# Patient Record
Sex: Male | Born: 1970 | Race: White | Hispanic: No | Marital: Married | Smoking: Former smoker
Health system: Southern US, Community
[De-identification: ages and names within clinical notes are randomized; demographics above are authoritative.]

## PROBLEM LIST (undated history)

## (undated) DIAGNOSIS — I10 Essential (primary) hypertension: Secondary | ICD-10-CM

## (undated) DIAGNOSIS — I219 Acute myocardial infarction, unspecified: Secondary | ICD-10-CM

## (undated) DIAGNOSIS — K5090 Crohn's disease, unspecified, without complications: Secondary | ICD-10-CM

## (undated) DIAGNOSIS — E119 Type 2 diabetes mellitus without complications: Secondary | ICD-10-CM

## (undated) DIAGNOSIS — G629 Polyneuropathy, unspecified: Secondary | ICD-10-CM

## (undated) DIAGNOSIS — I639 Cerebral infarction, unspecified: Secondary | ICD-10-CM

## (undated) HISTORY — PX: HERNIA REPAIR: SHX51

---

## 2013-05-27 ENCOUNTER — Emergency Department (HOSPITAL_COMMUNITY)
Admission: EM | Admit: 2013-05-27 | Discharge: 2013-05-28 | Disposition: A | Discharge status: Home / Self Care | Payer: Self-pay | Attending: Emergency Medicine | Admitting: Emergency Medicine

## 2013-05-27 ENCOUNTER — Encounter (HOSPITAL_COMMUNITY): Payer: Self-pay | Admitting: Emergency Medicine

## 2013-05-27 ENCOUNTER — Emergency Department (HOSPITAL_COMMUNITY): Payer: Self-pay

## 2013-05-27 DIAGNOSIS — Z8719 Personal history of other diseases of the digestive system: Secondary | ICD-10-CM | POA: Insufficient documentation

## 2013-05-27 DIAGNOSIS — R209 Unspecified disturbances of skin sensation: Secondary | ICD-10-CM | POA: Insufficient documentation

## 2013-05-27 DIAGNOSIS — Z8669 Personal history of other diseases of the nervous system and sense organs: Secondary | ICD-10-CM | POA: Insufficient documentation

## 2013-05-27 DIAGNOSIS — I10 Essential (primary) hypertension: Secondary | ICD-10-CM | POA: Insufficient documentation

## 2013-05-27 DIAGNOSIS — M79609 Pain in unspecified limb: Secondary | ICD-10-CM | POA: Insufficient documentation

## 2013-05-27 DIAGNOSIS — R6883 Chills (without fever): Secondary | ICD-10-CM | POA: Insufficient documentation

## 2013-05-27 DIAGNOSIS — E119 Type 2 diabetes mellitus without complications: Secondary | ICD-10-CM | POA: Insufficient documentation

## 2013-05-27 DIAGNOSIS — Z87891 Personal history of nicotine dependence: Secondary | ICD-10-CM | POA: Insufficient documentation

## 2013-05-27 DIAGNOSIS — I252 Old myocardial infarction: Secondary | ICD-10-CM | POA: Insufficient documentation

## 2013-05-27 DIAGNOSIS — L84 Corns and callosities: Secondary | ICD-10-CM | POA: Insufficient documentation

## 2013-05-27 DIAGNOSIS — M79671 Pain in right foot: Secondary | ICD-10-CM

## 2013-05-27 HISTORY — DX: Polyneuropathy, unspecified: G62.9

## 2013-05-27 HISTORY — DX: Type 2 diabetes mellitus without complications: E11.9

## 2013-05-27 HISTORY — DX: Acute myocardial infarction, unspecified: I21.9

## 2013-05-27 HISTORY — DX: Essential (primary) hypertension: I10

## 2013-05-27 HISTORY — DX: Crohn's disease, unspecified, without complications: K50.90

## 2013-05-27 HISTORY — DX: Cerebral infarction, unspecified: I63.9

## 2013-05-27 LAB — CBC WITH DIFFERENTIAL/PLATELET
BASOS ABS: 0 10*3/uL (ref 0.0–0.1)
BASOS PCT: 0 % (ref 0–1)
Eosinophils Absolute: 0.4 10*3/uL (ref 0.0–0.7)
Eosinophils Relative: 3 % (ref 0–5)
HEMATOCRIT: 39.1 % (ref 39.0–52.0)
HEMOGLOBIN: 14.4 g/dL (ref 13.0–17.0)
Lymphocytes Relative: 31 % (ref 12–46)
Lymphs Abs: 3.6 10*3/uL (ref 0.7–4.0)
MCH: 33.6 pg (ref 26.0–34.0)
MCHC: 36.8 g/dL — ABNORMAL HIGH (ref 30.0–36.0)
MCV: 91.4 fL (ref 78.0–100.0)
MONOS PCT: 7 % (ref 3–12)
Monocytes Absolute: 0.9 10*3/uL (ref 0.1–1.0)
NEUTROS ABS: 6.7 10*3/uL (ref 1.7–7.7)
NEUTROS PCT: 58 % (ref 43–77)
Platelets: 213 10*3/uL (ref 150–400)
RBC: 4.28 MIL/uL (ref 4.22–5.81)
RDW: 12.5 % (ref 11.5–15.5)
WBC: 11.5 10*3/uL — AB (ref 4.0–10.5)

## 2013-05-27 LAB — COMPREHENSIVE METABOLIC PANEL
ALT: 19 U/L (ref 0–53)
AST: 16 U/L (ref 0–37)
Albumin: 4.3 g/dL (ref 3.5–5.2)
Alkaline Phosphatase: 60 U/L (ref 39–117)
BUN: 17 mg/dL (ref 6–23)
CALCIUM: 9.3 mg/dL (ref 8.4–10.5)
CHLORIDE: 101 meq/L (ref 96–112)
CO2: 28 mEq/L (ref 19–32)
Creatinine, Ser: 1.04 mg/dL (ref 0.50–1.35)
GFR calc Af Amer: >90 mL/min (ref >90)
GFR calc non Af Amer: 88 mL/min — ABNORMAL LOW (ref >90)
GLUCOSE: 86 mg/dL (ref 70–99)
POTASSIUM: 4.3 meq/L (ref 3.7–5.3)
Sodium: 140 mEq/L (ref 137–147)
Total Bilirubin: 0.3 mg/dL (ref 0.3–1.2)
Total Protein: 7.9 g/dL (ref 6.0–8.3)

## 2013-05-27 MED ORDER — OXYCODONE-ACETAMINOPHEN 5-325 MG PO TABS: 1.0000 | ORAL_TABLET | Freq: Once | ORAL | Status: DC

## 2013-05-27 NOTE — ED Notes (Signed)
PT with callous to L foot x several months.  Several days ago pt noticed blood pooling under skin. Now entire bottom of foot tender on palpation and red.  Pt is diabetic.

## 2013-05-27 NOTE — ED Provider Notes (Signed)
CSN: [id number]     Arrival date & time 05/27/13  1847 History   First MD Initiated Contact with Patient 05/27/13 2227     Chief Complaint  Patient presents with  . Foot Pain   (Consider location/radiation/quality/duration/timing/severity/associated sxs/prior Treatment) Patient is a 43 y.o. male presenting with lower extremity pain.  Foot Pain Associated symptoms include chills. Pertinent negatives include no abdominal pain, chest pain, fever, nausea or vomiting.   43 yo male with PMH significant for Diabetes and peripheral neuropathy presents with 1-2 week hx of worsening right foot pain. Patient states he has had a callous on the bottom of the affected foot for about 4-5 months. Patient now reports constant throbbing pain in RIGHT foot when it is at rest and he rates it as 4/10. Pain is worse with weight bearing, which he described pain as sharp 10/10 with radiation up his lower leg. Patient denies any fever, CP, SOB, N/V/D. Denies any other symptoms. Patient states his blood glucose is well regulated and typically stays around 100-130s with diet only.   Past Medical History  Diagnosis Date  . Diabetes mellitus without complication   . Hypertension   . MI (myocardial infarction)   . Stroke   . Crohn's disease   . Neuropathy    Past Surgical History  Procedure Laterality Date  . Hernia repair     No family history on file. History  Substance Use Topics  . Smoking status: Former Smoker    Types: Cigarettes  . Smokeless tobacco: Not on file  . Alcohol Use: No    Review of Systems  Constitutional: Positive for chills. Negative for fever.  Respiratory: Negative for shortness of breath.   Cardiovascular: Negative for chest pain.  Gastrointestinal: Negative for nausea, vomiting, abdominal pain, diarrhea and constipation.  Skin: Positive for wound (RIGHT foot ).  Neurological:       Admits to parasthesias in first 3 toes of RIGHT foot.   All other systems reviewed and are  negative.    Allergies  Review of patient's allergies indicates no known allergies.  Home Medications   Current Outpatient Rx  Name  Route  Sig  Dispense  Refill  . acetaminophen (TYLENOL) 500 MG tablet   Oral   Take 1,000 mg by mouth every 6 (six) hours as needed for moderate pain.         . Skin Protectants, Misc. (EUCERIN) cream   Topical   Apply 1 application topically daily as needed for dry skin.         Marland Kitchen. oxyCODONE-acetaminophen (PERCOCET/ROXICET) 5-325 MG per tablet   Oral   Take 1-2 tablets by mouth every 6 (six) hours as needed for moderate pain or severe pain.   20 tablet   0    BP 127/72  Pulse 82  Temp(Src) 97.3 F (36.3 C) (Oral)  Resp 18  Ht 6\' 2"  (1.88 m)  Wt 224 lb (101.606 kg)  BMI 28.75 kg/m2  SpO2 99% Physical Exam  Nursing note and vitals reviewed. Constitutional: He is oriented to person, place, and time. He appears well-developed and well-nourished. No distress.  HENT:  Head: Normocephalic and atraumatic.  Eyes: Conjunctivae and EOM are normal.  Neck: Normal range of motion.  Cardiovascular: Normal rate, regular rhythm and normal heart sounds.   Pulmonary/Chest: Effort normal and breath sounds normal.  Musculoskeletal:       Feet:  No lower leg swelling or tenderness.   Neurological: He is alert and oriented to person,  place, and time.  Skin: Skin is warm and dry. No rash noted. He is not diaphoretic.  Psychiatric: He has a normal mood and affect. His behavior is normal.    ED Course  Procedures (including critical care time) Labs Review Labs Reviewed  CBC WITH DIFFERENTIAL - Abnormal; Notable for the following:    WBC 11.5 (*)    MCHC 36.8 (*)    All other components within normal limits  COMPREHENSIVE METABOLIC PANEL - Abnormal; Notable for the following:    GFR calc non Af Amer 88 (*)    All other components within normal limits   Imaging Review Dg Foot Complete Right  05/27/2013   CLINICAL DATA:  Ulceration on the  plantar surface of the foot for 1 week. Right foot pain.  EXAM: RIGHT FOOT COMPLETE - 3+ VIEW  COMPARISON:  Plain films right foot 05/07/2013.  FINDINGS: No evidence of osteomyelitis is identified. There is no fracture or dislocation. No soft tissue gas collection or radiopaque foreign body.  IMPRESSION: No acute finding.   Electronically Signed   By: Drusilla Kanner M.D.   On: 05/27/2013 20:58    EKG Interpretation   None       MDM   1. Right foot pain    Plain films of affected foot show no acute findings.  Mild Leukocytosis.  Patient afebrile with normal VS.   Advised follow up with podiatry for foot pain secondary to callous formation. Take medications as prescribed. Do not drive with pain medication. Return to ED should you develop Fever/chills, progressively worsening redness and swelling around the callous formation, or your affected foot loses color and becomes cool to touch.   Meds given in ED:  Medications - No data to display  Discharge Medication List as of 05/28/2013 12:18 AM    START taking these medications   Details  oxyCODONE-acetaminophen (PERCOCET/ROXICET) 5-325 MG per tablet Take 1-2 tablets by mouth every 6 (six) hours as needed for moderate pain or severe pain., Starting 05/28/2013, Until Discontinued, Print              Rudene Anda, PA-C 05/28/13 1236

## 2013-05-28 MED ORDER — OXYCODONE-ACETAMINOPHEN 5-325 MG PO TABS: 1.0000 | ORAL_TABLET | Freq: Four times a day (QID) | ORAL | Status: DC | PRN

## 2013-05-28 NOTE — Discharge Instructions (Signed)
Follow up with podiatry for foot pain secondary to callous formation. Take medications as prescribed. Do not drive with pain medication. Return to ED should you develop Fever/chills, progressively worsening redness and swelling around the callous formation,  or your affected foot loses color and becomes cool to touch.    Emergency Department Resource Guide 1) Find a Doctor and Pay Out of Pocket Although you won't have to find out who is covered by your insurance plan, it is a good idea to ask around and get recommendations. You will then need to call the office and see if the doctor you have chosen will accept you as a new patient and what types of options they offer for patients who are self-pay. Some doctors offer discounts or will set up payment plans for their patients who do not have insurance, but you will need to ask so you aren't surprised when you get to your appointment.  2) Contact Your Local Health Department Not all health departments have doctors that can see patients for sick visits, but many do, so it is worth a call to see if yours does. If you don't know where your local health department is, you can check in your phone book. The CDC also has a tool to help you locate your state's health department, and many state websites also have listings of all of their local health departments.  3) Find a Walk-in Clinic If your illness is not likely to be very severe or complicated, you may want to try a walk in clinic. These are popping up all over the country in pharmacies, drugstores, and shopping centers. They're usually staffed by nurse practitioners or physician assistants that have been trained to treat common illnesses and complaints. They're usually fairly quick and inexpensive. However, if you have serious medical issues or chronic medical problems, these are probably not your best option.  No Primary Care Doctor: - Call Health Connect at  [phone] - they can help you locate a primary  care doctor that  accepts your insurance, provides certain services, etc. - Physician Referral Service- [phone]  Chronic Pain Problems: Organization         Address  Phone   Notes  [facility]  [phone] Patients need to be referred by their primary care doctor.   Medication Assistance: Organization         Address  Phone   Notes  [facility]ication [facility] [address] [place], [place] [address] [phone] --Must be a resident of [facility] [place] -- Must have NO insurance coverage whatsoever (no Medicaid/ Medicare, etc.) -- The pt. MUST have a primary care doctor that directs their care regularly and follows them in the community   [facility]ist  [phone]   [employer]  [phone]) [phone]    Agencies that provide inexpensive medical care: Organization         Address  Phone   Notes  [facility]  [phone]) [phone]   [facility]    [phone]   [facility] [address] [place], [place] [address] [phone]   [facility] [place] [address], [place] [phone]   [facility]    [phone](336) [phone]   [facility]    [phone]   [facility]  [address], [place] Phone:  [phone](336) [phone], Fax:  [phone] Hours of Operation:  9  am - 6 pm, M-F.  Also accepts Medicaid/Medicare and self-pay.  [facility]  [address], [place] Phone: [phone], Fax: [phone]) [phone]. Hours of Operation:  8:30 am - 5:30 pm, M-F.  Also accepts Medicaid and self-pay.  [facility] [place] [address], [place] Phone: [phone]   [facility] [address], [place], [place] [phone], Ext. [phone] Mondays & Thursdays: 7-9 AM.  First 15 patients are seen on a first come, first serve basis.    Medicaid-accepting [facility]  Providers:  Organization         Address  Phone   Notes  [facility] [address], [place] [phone]) [phone] Also accepts self-pay patients.  [facility] [address] [place], [place]  [phone]   [facility] [address], [place] [phone](336) [phone]   [facility] [address] [place] [address], [place] [phone]) [phone]   Renaye RakersVeita Bland [address], [place]   [phone] Only accepts [place] Access [place] patients after they have their name applied to their card.   Self-Pay (no insurance) in [facility] [place]:  Organization         Address  Phone   Notes  Sickle Cell Patients, [facility] [address], [place] [phone]) [phone]   [facility] [address], [place] [phone]) [phone]   [facility] [place]  [address], [place] [phone]) [phone]   [facility]/Dr. Osei-Bonsu  [address] [place] [address], [place] or [phone], [place] [phone] Phone number for both [place] and [place] locations is the same.  [facility] [address], [place] [phone]   [facility] [place] [address] [place] [address], [place] or [address] [phone]) [phone]   [facility] [address], [place] [phone]) [phone], phone; [phone]) [phone], fax Sees patients 1st and 3rd Saturday of every month.  Must not qualify for public or private insurance (i.e. Medicaid, Medicare, [place] Health Choice, Veterans' Benefits)  Household income should be no more than 200% of the poverty level The clinic cannot treat you if you are pregnant or think you are pregnant  Sexually transmitted diseases are not treated at the clinic.    Dental Care: Organization         Address  Phone  Notes  [facility] [address], [place] [phone] Accepts children up to age 41 who are enrolled in [place] or [place] Health Choice; pregnant women with a Medicaid card; and children who have applied for Medicaid or [place] Health Choice, but were declined, whose parents can pay a reduced fee at time of service.  [facility] [place]  [address], [place] [phone] Accepts children up to age 41 who are enrolled in [place] or [place] Health Choice; pregnant women with a Medicaid card; and children who have applied for Medicaid or [place] Health Choice, but were declined, whose parents can pay a reduced fee at time of service.  [facility]  [address], [place] [phone]) [phone] Patients are seen by appointment only. Walk-ins are not accepted. [facility] will see patients 18 years of  age and older. °Monday - Tuesday (8am-5pm) °Most Wednesdays (8:30-5pm) °$30 per visit, cash only  °[facility] ° [address], [place] [phone] Patients are seen by appointment only. Walk-ins are not accepted. [facility] will see patients 18 years of age and older. °One Wednesday Evening (Monthly: Volunteer Based).  $30 per visit, cash only  °[facility]  [phone] for adults; Children under age 4, call Graduate Pediatric Dentistry at [phone]. Children aged 4-14, please call [phone] to request a pediatric application. ° Dental services are provided in all areas of dental care including fillings, crowns and bridges, complete and partial dentures, implants, gum treatment, root canals, and extractions. Preventive care is also provided. Treatment is provided to both adults and children. °Patients are selected via a lottery and there is often a waiting list. °  °[facility] [address], °[place] ° [phone] [email or web address] °  °[facility]  [address], [place], [place] [phone], Ext. [phone] Second and Fourth Thursday of each month, opens at 6:30 AM; Clinic ends at 9 AM.  Patients are seen on a first-come first-served basis, and a limited number are seen during each clinic.  ° °[facility] ° [address], [place], [place] [phone]   Eligibility Requirements °You must have lived in [place], [place], or [place] counties for at least the last three months. °  You cannot be eligible for state or federal sponsored healthcare insurance, including Veterans Administration, Medicaid, or Medicare. °  You generally cannot be eligible for healthcare insurance through your employer.  °  How to apply: °Eligibility screenings are held every Tuesday and Wednesday afternoon from 1:00 pm until 4:00 pm. You do not need an appointment for the interview!  °[facility] [address], [place], [place] [phone]   °[facility]  336-342-8273   °[facility]  336-703-3100   °[facility]  336-570-6415   ° °Behavioral Health Resources in the Community: °Intensive Outpatient Programs °Organization         Address  Phone  Notes  °[place] [facility] [address], [place], [place] [phone]   °[facility] [address], [place], [place] [phone]   °[facility] [address], [place], [place] ° 336-882-2125   °[facility] [address],  °[place], [place] 1-800-853-5163 or 336-641-4981   °Substance Abuse Resources °Organization         Address  Phone  Notes  °[facility]  336-882-2125   °[facility]  336-784-9470   °[facility]  336-285-9073   °[facility]  336-845-3988   °Residential & Outpatient Substance Abuse Program  1-800-659-3381   °Psychological Services °Organization         Address  Phone  Notes  °[facility]  336- 832-9600    °[facility]  336- 378-7881   °[facility] [address], [place] 1-800-853-5163 or 336-641-4981   ° °Mobile Crisis Teams °Organization         Address  Phone  Notes  °[facility], Mobile Crisis Care Unit  1-877-626-1772   °Assertive °Psychotherapeutic Services ° [address] [place], [place] 336-834-9664   °Sharon DeEsch [address] °[place] 336-554-5454   ° °Self-Help/Support Groups °Organization           Address  Phone             Notes  [place] of [place] - variety of support groups  [place] Call for more information  Narcotics Anonymous (NA), [facility] [address], [employer] [place]  2 meetings at this location   Special educational needs teacher         Address  Phone  Notes  [facility] [place],    [place]  1-3307155621   [facility]  [address], [place] [address]24, [place], [place]   [place], [place] [phone] Admissions: 8am-3pm M-F  Incentives Substance [place] [address],    [place], [place] [phone]   [facility] [address] [place], [place], [place]   [facility] [address],  [place], [place]   [facility] - Intensive Outpatient [address], [place], [place]   [facility] [address]  [place], [place] 1-814-487-9928 or [phone]   [facility] ([facility]) [address] [place], [place] Accepts Medicaid  [facility] [address]  [place] 1-(540) [phone] Substance Abuse/Addiction Treatment   [facility] Organization         Address  Phone  Notes  [facility]  [phone]   Domenic Schwab, PhD [address] [place], [place]   [address] or [phone]    [place], [place] [phone]   [facility] [address], [place], [place] [phone] Insurance/Medicaid/sponsorship through [facility] Families [address], [address]                                    [place], [place] [phone] [place] [address], [place] [phone]    Dr. Adele Schilder  [phone]   [facility]. 1) [address], [place] 2) [place] 3)  [place] [address], [place] [phone]  [phone]   [place] [phone] or [phone] (After Hours)

## 2013-05-28 NOTE — ED Provider Notes (Signed)
Medical screening examination/treatment/procedure(s) were conducted as a shared visit with non-physician practitioner(s) and myself.  I personally evaluated the patient during the encounter.  EKG Interpretation   None      Right foot callus with local ecchymosis, CR<2secs, baseline LT mild neuropathy  Doubt ischemia, infection, or significant trauma.  Hurman HornJohn M Lavante Toso, MD 05/30/13 [phone]

## 2013-05-30 ENCOUNTER — Encounter (HOSPITAL_COMMUNITY): Payer: Self-pay | Admitting: Emergency Medicine

## 2013-05-30 ENCOUNTER — Emergency Department (HOSPITAL_COMMUNITY)
Admission: EM | Admit: 2013-05-30 | Discharge: 2013-05-30 | Disposition: A | Discharge status: Home / Self Care | Payer: Self-pay | Attending: Emergency Medicine | Admitting: Emergency Medicine

## 2013-05-30 DIAGNOSIS — I252 Old myocardial infarction: Secondary | ICD-10-CM | POA: Insufficient documentation

## 2013-05-30 DIAGNOSIS — L84 Corns and callosities: Secondary | ICD-10-CM | POA: Insufficient documentation

## 2013-05-30 DIAGNOSIS — Z87891 Personal history of nicotine dependence: Secondary | ICD-10-CM | POA: Insufficient documentation

## 2013-05-30 DIAGNOSIS — Z760 Encounter for issue of repeat prescription: Secondary | ICD-10-CM | POA: Insufficient documentation

## 2013-05-30 DIAGNOSIS — E119 Type 2 diabetes mellitus without complications: Secondary | ICD-10-CM | POA: Insufficient documentation

## 2013-05-30 DIAGNOSIS — Z8673 Personal history of transient ischemic attack (TIA), and cerebral infarction without residual deficits: Secondary | ICD-10-CM | POA: Insufficient documentation

## 2013-05-30 DIAGNOSIS — I10 Essential (primary) hypertension: Secondary | ICD-10-CM | POA: Insufficient documentation

## 2013-05-30 DIAGNOSIS — L0390 Cellulitis, unspecified: Secondary | ICD-10-CM

## 2013-05-30 DIAGNOSIS — Z8719 Personal history of other diseases of the digestive system: Secondary | ICD-10-CM | POA: Insufficient documentation

## 2013-05-30 DIAGNOSIS — L03119 Cellulitis of unspecified part of limb: Secondary | ICD-10-CM

## 2013-05-30 DIAGNOSIS — L02619 Cutaneous abscess of unspecified foot: Secondary | ICD-10-CM | POA: Insufficient documentation

## 2013-05-30 MED ORDER — CEPHALEXIN 500 MG PO CAPS: 500.0000 mg | ORAL_CAPSULE | Freq: Four times a day (QID) | ORAL | Status: DC

## 2013-05-30 MED ORDER — SULFAMETHOXAZOLE-TRIMETHOPRIM 800-160 MG PO TABS: 1.0000 | ORAL_TABLET | Freq: Two times a day (BID) | ORAL | Status: AC

## 2013-05-30 NOTE — ED Notes (Signed)
Pt has large callous to bottom of right foot, was seen here on 1/19 for same due to having a blood blister on his foot, was dc home with percocet which he has not filled. Pt came back today due to the blister rupturing had having bleeding to bottom of foot. No redness or swelling noted, afebrile and ambulatory at triage.

## 2013-05-30 NOTE — ED Provider Notes (Signed)
CSN: [id number]     Arrival date & time 05/30/13  1739 History   First MD Initiated Contact with Patient 05/30/13 1838     Chief Complaint  Patient presents with  . Foot Pain   (Consider location/radiation/quality/duration/timing/severity/associated sxs/prior Treatment) HPI Comments: Pt is a 43 y/o male with a PMHx of diabetes, HTN, MI, and neuropathy who presents to the ED complaining of worsening callous to the bottom of his right foot since being seen in the ED on 1/19 for the same. At that time he was discharged home with Percocet which she has not yet filled, and has a scheduled for followup with the podiatrist next week. He returns today because he states the blister "ruptured" and was bleeding, states his pain has worsened. Pain worse with pressure. Denies fever or chills.  Patient is a 43 y.o. male presenting with lower extremity pain. The history is provided by the patient.  Foot Pain    Past Medical History  Diagnosis Date  . Diabetes mellitus without complication   . Hypertension   . MI (myocardial infarction)   . Stroke   . Crohn's disease   . Neuropathy    Past Surgical History  Procedure Laterality Date  . Hernia repair     History reviewed. No pertinent family history. History  Substance Use Topics  . Smoking status: Former Smoker    Types: Cigarettes  . Smokeless tobacco: Not on file  . Alcohol Use: No    Review of Systems  Skin: Positive for wound.  All other systems reviewed and are negative.    Allergies  Review of patient's allergies indicates no known allergies.  Home Medications   Current Outpatient Rx  Name  Route  Sig  Dispense  Refill  . acetaminophen (TYLENOL) 500 MG tablet   Oral   Take 1,000 mg by mouth every 6 (six) hours as needed for moderate pain.         . Skin Protectants, Misc. (EUCERIN) cream   Topical   Apply 1 application topically daily as needed for dry skin.         . cephALEXin (KEFLEX) 500 MG capsule   Oral  Take 1 capsule (500 mg total) by mouth 4 (four) times daily.   28 capsule   0   . oxyCODONE-acetaminophen (PERCOCET/ROXICET) 5-325 MG per tablet   Oral   Take 1-2 tablets by mouth every 6 (six) hours as needed for moderate pain or severe pain.   20 tablet   0   . sulfamethoxazole-trimethoprim (BACTRIM DS,SEPTRA DS) 800-160 MG per tablet   Oral   Take 1 tablet by mouth 2 (two) times daily.   14 tablet   0    BP 144/77  Pulse 77  Temp(Src) 97.9 F (36.6 C) (Oral)  Resp 18  SpO2 98% Physical Exam  Nursing note and vitals reviewed. Constitutional: He is oriented to person, place, and time. He appears well-developed and well-nourished. No distress.  HENT:  Head: Normocephalic and atraumatic.  Mouth/Throat: Oropharynx is clear and moist.  Eyes: Conjunctivae are normal.  Neck: Normal range of motion. Neck supple.  Cardiovascular: Normal rate, regular rhythm and normal heart sounds.   Pulmonary/Chest: Effort normal and breath sounds normal.  Abdominal: Soft.  Musculoskeletal: Normal range of motion. He exhibits no edema.  2 cm diameter callus formation under lateral aspect of the palm of right foot, tender, no active bleeding or open wounds. 1 cm surrounding cellulitis, tender and warm. Cellulitis does not cross  the joint line.  Neurological: He is alert and oriented to person, place, and time. No sensory deficit.  Skin: Skin is warm and dry. He is not diaphoretic.  Psychiatric: He has a normal mood and affect. His behavior is normal.    ED Course  Procedures (including critical care time) Labs Review Labs Reviewed - No data to display Imaging Review No results found.  EKG Interpretation   None       MDM   1. Callus of foot   2. Cellulitis    Patient with callous formation the bottom of foot with associated cellulitis. Well appearing and in NAD, normal VS. Cellulitis does not cross the joint line. Will empirically treat with antibiotics, he will fill his Percocet  prescription which he still has. Followup with the podiatrist next week as scheduled. Return precautions given. He should states understanding of plan and is agreeable.   Trevor Mace, PA-C 05/30/13 1935

## 2013-05-30 NOTE — ED Notes (Signed)
Pt has large callous on right footbed; no odor present; no drainage at this time. Reports it has been bleeding. Has foot MD appt. Monday.

## 2013-05-30 NOTE — Discharge Instructions (Signed)
Take antibiotic to completion. Followup with the foot doctor. Take the pain medication that was prescribed to you the other day as needed for severe pain.  Cellulitis Cellulitis is an infection of the skin and the tissue beneath it. The infected area is usually red and tender. Cellulitis occurs most often in the arms and lower legs.  CAUSES  Cellulitis is caused by bacteria that enter the skin through cracks or cuts in the skin. The most common types of bacteria that cause cellulitis are Staphylococcus and Streptococcus. SYMPTOMS   Redness and warmth.  Swelling.  Tenderness or pain.  Fever. DIAGNOSIS  Your caregiver can usually determine what is wrong based on a physical exam. Blood tests may also be done. TREATMENT  Treatment usually involves taking an antibiotic medicine. HOME CARE INSTRUCTIONS   Take your antibiotics as directed. Finish them even if you start to feel better.  Keep the infected arm or leg elevated to reduce swelling.  Apply a warm cloth to the affected area up to 4 times per day to relieve pain.  Only take over-the-counter or prescription medicines for pain, discomfort, or fever as directed by your caregiver.  Keep all follow-up appointments as directed by your caregiver. SEEK MEDICAL CARE IF:   You notice red streaks coming from the infected area.  Your red area gets larger or turns dark in color.  Your bone or joint underneath the infected area becomes painful after the skin has healed.  Your infection returns in the same area or another area.  You notice a swollen bump in the infected area.  You develop new symptoms. SEEK IMMEDIATE MEDICAL CARE IF:   You have a fever.  You feel very sleepy.  You develop vomiting or diarrhea.  You have a general ill feeling (malaise) with muscle aches and pains. MAKE SURE YOU:   Understand these instructions.  Will watch your condition.  Will get help right away if you are not doing well or get  worse. Document Released: 02/02/2005 Document Revised: 10/25/2011 Document Reviewed: 07/11/2011 [facility] Patient Information 2014 [place], [place].  Corns and Calluses Corns are small areas of thickened skin that usually occur on the top, sides, or tip of a toe. They contain a cone-shaped core with a point that can press on a nerve below. This causes pain. Calluses are areas of thickened skin that usually develop on hands, fingers, palms, soles of the feet, and heels. These are areas that experience frequent friction or pressure. CAUSES  Corns are usually the result of rubbing (friction) or pressure from shoes that are too tight or do not fit properly. Calluses are caused by repeated friction and pressure on the affected areas. SYMPTOMS  A hard growth on the skin.  Pain or tenderness under the skin.  Sometimes, redness and swelling.  Increased discomfort while wearing tight-fitting shoes. DIAGNOSIS  Your caregiver can usually tell what the problem is by doing a physical exam. TREATMENT  Removing the cause of the friction or pressure is usually the only treatment needed. However, sometimes medicines can be used to help soften the hardened, thickened areas. These medicines include salicylic acid plasters and 12% ammonium lactate lotion. These medicines should only be used under the direction of your caregiver. HOME CARE INSTRUCTIONS   Try to remove pressure from the affected area.  You may wear donut-shaped corn pads to protect your skin.  You may use a pumice stone or nonmetallic nail file to gently reduce the thickness of a corn.  Wear  properly fitted footwear.  If you have calluses on the hands, wear gloves during activities that cause friction.  If you have diabetes, you should regularly examine your feet. Tell your caregiver if you notice any problems with your feet. SEEK IMMEDIATE MEDICAL CARE IF:   You have increased pain, swelling, redness, or warmth in the affected  area.  Your corn or callus starts to drain fluid or bleeds.  You are not getting better, even with treatment. Document Released: 01/30/2004 Document Revised: 07/18/2011 Document Reviewed: 12/21/2010 [facility] Patient Information 2014 [place], [place].

## 2013-05-31 NOTE — ED Provider Notes (Signed)
Medical screening examination/treatment/procedure(s) were performed by non-physician practitioner and as supervising physician I was immediately available for consultation/collaboration.  EKG Interpretation   None         Pacen Watford W Fitz Matsuo, MD 05/31/13 1113 

## 2013-12-23 ENCOUNTER — Encounter (HOSPITAL_COMMUNITY): Payer: Self-pay | Admitting: Emergency Medicine

## 2013-12-23 ENCOUNTER — Emergency Department (HOSPITAL_COMMUNITY): Payer: Medicaid Other

## 2013-12-23 ENCOUNTER — Emergency Department (HOSPITAL_COMMUNITY)
Admission: EM | Admit: 2013-12-23 | Discharge: 2013-12-23 | Disposition: A | Discharge status: Home / Self Care | Payer: Medicaid Other | Attending: Emergency Medicine | Admitting: Emergency Medicine

## 2013-12-23 DIAGNOSIS — X500XXA Overexertion from strenuous movement or load, initial encounter: Secondary | ICD-10-CM | POA: Insufficient documentation

## 2013-12-23 DIAGNOSIS — Y9289 Other specified places as the place of occurrence of the external cause: Secondary | ICD-10-CM | POA: Diagnosis not present

## 2013-12-23 DIAGNOSIS — E119 Type 2 diabetes mellitus without complications: Secondary | ICD-10-CM | POA: Insufficient documentation

## 2013-12-23 DIAGNOSIS — S99929A Unspecified injury of unspecified foot, initial encounter: Secondary | ICD-10-CM

## 2013-12-23 DIAGNOSIS — Y9389 Activity, other specified: Secondary | ICD-10-CM | POA: Diagnosis not present

## 2013-12-23 DIAGNOSIS — Z8719 Personal history of other diseases of the digestive system: Secondary | ICD-10-CM | POA: Diagnosis not present

## 2013-12-23 DIAGNOSIS — S99919A Unspecified injury of unspecified ankle, initial encounter: Secondary | ICD-10-CM | POA: Diagnosis present

## 2013-12-23 DIAGNOSIS — I10 Essential (primary) hypertension: Secondary | ICD-10-CM | POA: Diagnosis not present

## 2013-12-23 DIAGNOSIS — I252 Old myocardial infarction: Secondary | ICD-10-CM | POA: Diagnosis not present

## 2013-12-23 DIAGNOSIS — S93401A Sprain of unspecified ligament of right ankle, initial encounter: Secondary | ICD-10-CM

## 2013-12-23 DIAGNOSIS — S93409A Sprain of unspecified ligament of unspecified ankle, initial encounter: Secondary | ICD-10-CM | POA: Insufficient documentation

## 2013-12-23 DIAGNOSIS — Z87891 Personal history of nicotine dependence: Secondary | ICD-10-CM | POA: Insufficient documentation

## 2013-12-23 DIAGNOSIS — S8990XA Unspecified injury of unspecified lower leg, initial encounter: Secondary | ICD-10-CM | POA: Insufficient documentation

## 2013-12-23 DIAGNOSIS — Z8669 Personal history of other diseases of the nervous system and sense organs: Secondary | ICD-10-CM | POA: Insufficient documentation

## 2013-12-23 DIAGNOSIS — Z8673 Personal history of transient ischemic attack (TIA), and cerebral infarction without residual deficits: Secondary | ICD-10-CM | POA: Insufficient documentation

## 2013-12-23 NOTE — Discharge Instructions (Signed)
Your x-rays did not show any broken bones or other concerning injury. Your provider(s) today suspect an ankle sprain. Followup with your primary care provider or an orthopedic specialist for continued evaluation and treatment. Use rest, ice, compression and elevation to reduce pain and swelling.    Ankle Sprain An ankle sprain is an injury to the strong, fibrous tissues (ligaments) that hold the bones of your ankle joint together.  CAUSES An ankle sprain is usually caused by a fall or by twisting your ankle. Ankle sprains most commonly occur when you step on the outer edge of your foot, and your ankle turns inward. People who participate in sports are more prone to these types of injuries.  SYMPTOMS   Pain in your ankle. The pain may be present at rest or only when you are trying to stand or walk.  Swelling.  Bruising. Bruising may develop immediately or within 1 to 2 days after your injury.  Difficulty standing or walking, particularly when turning corners or changing directions. DIAGNOSIS  Your caregiver will ask you details about your injury and perform a physical exam of your ankle to determine if you have an ankle sprain. During the physical exam, your caregiver will press on and apply pressure to specific areas of your foot and ankle. Your caregiver will try to move your ankle in certain ways. An X-ray exam may be done to be sure a bone was not broken or a ligament did not separate from one of the bones in your ankle (avulsion fracture).  TREATMENT  Certain types of braces can help stabilize your ankle. Your caregiver can make a recommendation for this. Your caregiver may recommend the use of medicine for pain. If your sprain is severe, your caregiver may refer you to a surgeon who helps to restore function to parts of your skeletal system (orthopedist) or a physical therapist. HOME CARE INSTRUCTIONS   Apply ice to your injury for 1-2 days or as directed by your caregiver. Applying ice  helps to reduce inflammation and pain.  Put ice in a plastic bag.  Place a towel between your skin and the bag.  Leave the ice on for 15-20 minutes at a time, every 2 hours while you are awake.  Only take over-the-counter or prescription medicines for pain, discomfort, or fever as directed by your caregiver.  Elevate your injured ankle above the level of your heart as much as possible for 2-3 days.  If your caregiver recommends crutches, use them as instructed. Gradually put weight on the affected ankle. Continue to use crutches or a cane until you can walk without feeling pain in your ankle.  If you have a plaster splint, wear the splint as directed by your caregiver. Do not rest it on anything harder than a pillow for the first 24 hours. Do not put weight on it. Do not get it wet. You may take it off to take a shower or bath.  You may have been given an elastic bandage to wear around your ankle to provide support. If the elastic bandage is too tight (you have numbness or tingling in your foot or your foot becomes cold and blue), adjust the bandage to make it comfortable.  If you have an air splint, you may blow more air into it or let air out to make it more comfortable. You may take your splint off at night and before taking a shower or bath. Wiggle your toes in the splint several times per day to  decrease swelling. SEEK MEDICAL CARE IF:   You have rapidly increasing bruising or swelling.  Your toes feel extremely cold or you lose feeling in your foot.  Your pain is not relieved with medicine. SEEK IMMEDIATE MEDICAL CARE IF:  Your toes are numb or blue.  You have severe pain that is increasing. MAKE SURE YOU:   Understand these instructions.  Will watch your condition.  Will get help right away if you are not doing well or get worse. Document Released: 04/25/2005 Document Revised: 01/18/2012 Document Reviewed: 05/07/2011 [facility] LPExitCare Patient Information 2015 [place]ExitCare, [place].  This information is not intended to replace advice given to you by your health care provider. Make sure you discuss any questions you have with your health care provider.

## 2013-12-23 NOTE — ED Provider Notes (Signed)
CSN: [id number]     Arrival date & time 12/23/13  0047 History   First MD Initiated Contact with Patient 12/23/13 0151     Chief Complaint  Patient presents with  . Foot Pain   HPI  History provided by the patient. Patient is a 43 year old male with history of hypertension, diabetes, Crohn's disease, CAD, CVA and peripheral neuropathy who presents with complaints of right ankle pain and swelling after injury. Patient states he was walking down the stairs when he twisted his right ankle causing acute pain and swelling. He has been able to place weight on the ankle but it causes worsened pain. Denies any weakness or numbness. No other injuries from the accident. He has been taking Tylenol during the day which has helped with pain. No other treatments used.    Past Medical History  Diagnosis Date  . Diabetes mellitus without complication   . Hypertension   . MI (myocardial infarction)   . Stroke   . Crohn's disease   . Neuropathy    Past Surgical History  Procedure Laterality Date  . Hernia repair     No family history on file. History  Substance Use Topics  . Smoking status: Former Smoker    Types: Cigarettes  . Smokeless tobacco: Not on file  . Alcohol Use: No    Review of Systems  Neurological: Negative for weakness and numbness.  All other systems reviewed and are negative.     Allergies  Review of patient's allergies indicates no known allergies.  Home Medications   Prior to Admission medications   Medication Sig Start Date End Date Taking? Authorizing Provider  acetaminophen (TYLENOL) 500 MG tablet Take 1,000 mg by mouth every 6 (six) hours as needed for moderate pain.   Yes Historical Provider, MD   BP 168/92  Pulse 80  Temp(Src) 99 F (37.2 C) (Oral)  Resp 16  Ht 6\' 2"  (1.88 m)  Wt 235 lb (106.595 kg)  BMI 30.16 kg/m2  SpO2 99% Physical Exam  Nursing note and vitals reviewed. Constitutional: He appears well-developed and well-nourished.  HENT:   Head: Normocephalic.  Cardiovascular: Normal rate and regular rhythm.   Pulmonary/Chest: Effort normal and breath sounds normal. No respiratory distress.  Musculoskeletal: He exhibits edema and tenderness.  There is mild to moderate swelling with tenderness over the anterior right ankle and slightly into the distal tibia area. No pain or deformity over the lateral or medial malleolus. No of pain over the proximal fifth metatarsal area. Normal dorsal pedal pulses. Sensations at baseline.  Neurological: He is alert.  Skin: Skin is warm.  Psychiatric: He has a normal mood and affect.    ED Course  Procedures   COORDINATION OF CARE:  Nursing notes reviewed. Vital signs reviewed. Initial pt interview and examination performed.   Filed Vitals:   12/23/13 0052  BP: 168/92  Pulse: 80  Temp: 99 F (37.2 C)  TempSrc: Oral  Resp: 16  Height: 6\' 2"  (1.88 m)  Weight: 235 lb (106.595 kg)  SpO2: 99%    2:25 AM-patient seen and evaluated. Resting in bed appears comfortable at this time no acute distress. X-rays reviewed without signs of fracture or dislocation. Suspect ankle sprain. Offered the patient pain medication in the emergency room and he is declined. He also reports having crutches at home that he can use for comfort. Will place in an ASO. Patient instructed to use rest, ice, compression elevation.   Treatment plan initiated:Medications - No data to  display   Imaging Review Dg Ankle Complete Right  12/23/2013   CLINICAL DATA:  Right ankle pain. Fell down stairs. Recent navicular fracture on CT scan of 12/06/2013.  EXAM: RIGHT ANKLE - COMPLETE 3+ VIEW  COMPARISON:  CT scan 12/06/2013.  FINDINGS: The ankle mortise is maintained. No acute fracture or osteochondral abnormality. No joint effusion.  IMPRESSION: No acute ankle fracture.   Electronically Signed   By: Loralie ChampagneMark  Gallerani M.D.   On: 12/23/2013 01:23   MDM   Final diagnoses:  Ankle sprain, right, initial encounter         Angus Sellereter S Jamille Yoshino, PA-C 12/23/13 0230

## 2013-12-23 NOTE — ED Notes (Signed)
Pt reports he rolled R ankle yesterday coming down stairs. Presents with swelling to ankle and pain to top aspect of foot.

## 2013-12-24 NOTE — ED Provider Notes (Signed)
Medical screening examination/treatment/procedure(s) were conducted as a shared visit with non-physician practitioner(s) and myself.  I personally evaluated the patient during the encounter.   EKG Interpretation None      Pt with ankle pain, he twisted his leg earlier. Neurovascularly intact. Will treat with RICE.  Derwood KaplanAnkit Stephon Weathers, MD 12/24/13 [phone]

## 2015-02-14 IMAGING — CR DG FOOT COMPLETE 3+V*R*
3 series · 3 of 3 positions shown · non-contrast
Comparison: Plain films right foot 05/07/2013.

CLINICAL DATA: Ulceration on the plantar surface of the foot for 1
week. Right foot pain.

EXAM:
RIGHT FOOT COMPLETE - 3+ VIEW

[x foot ap right]
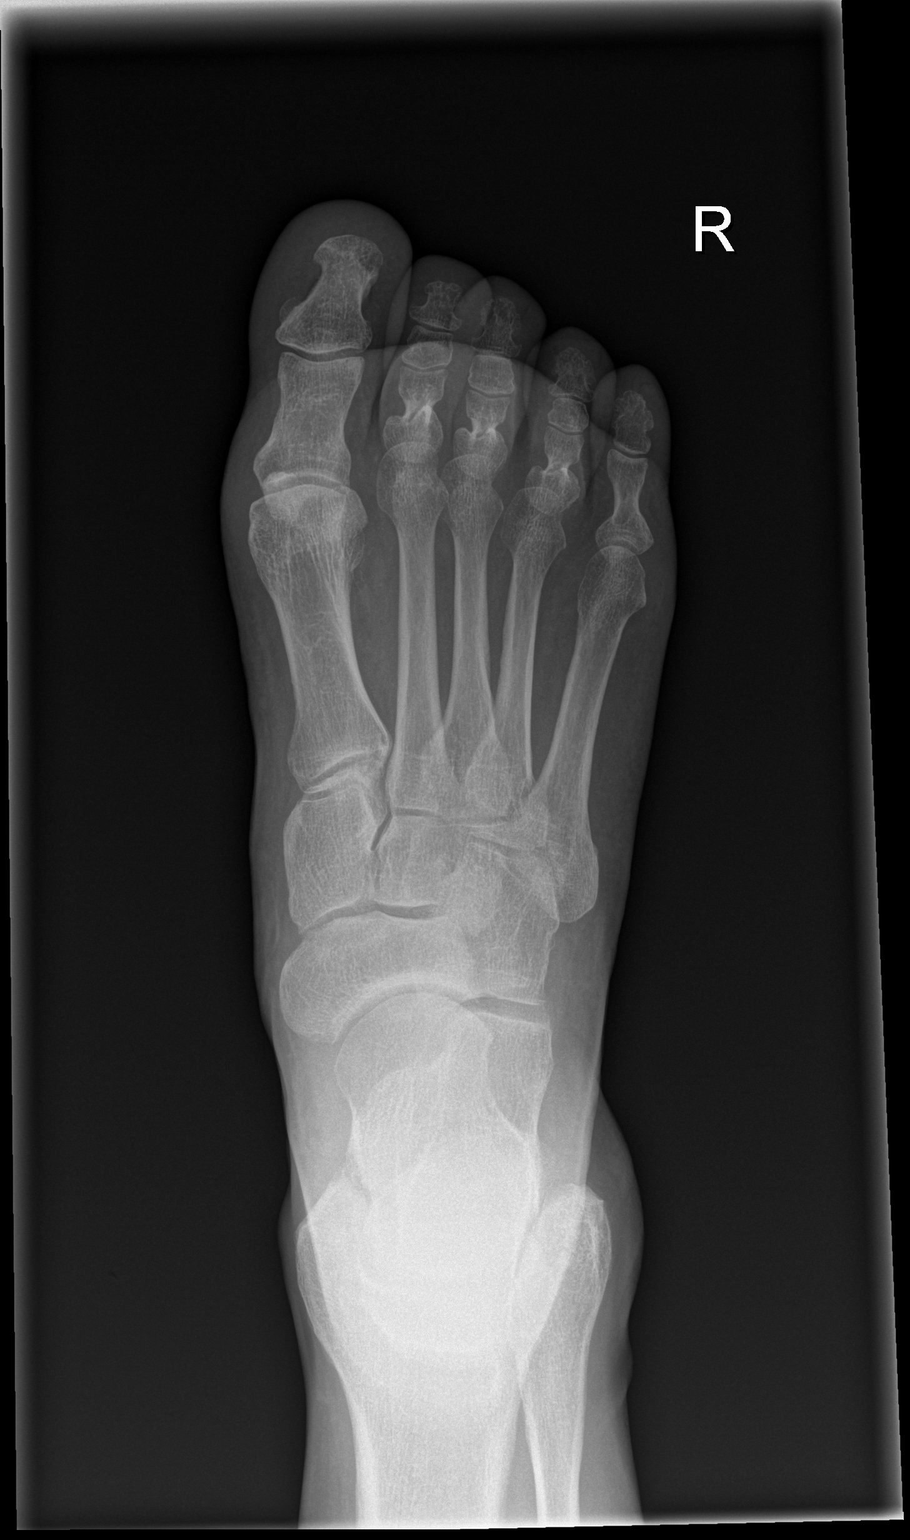

[x foot obl right]
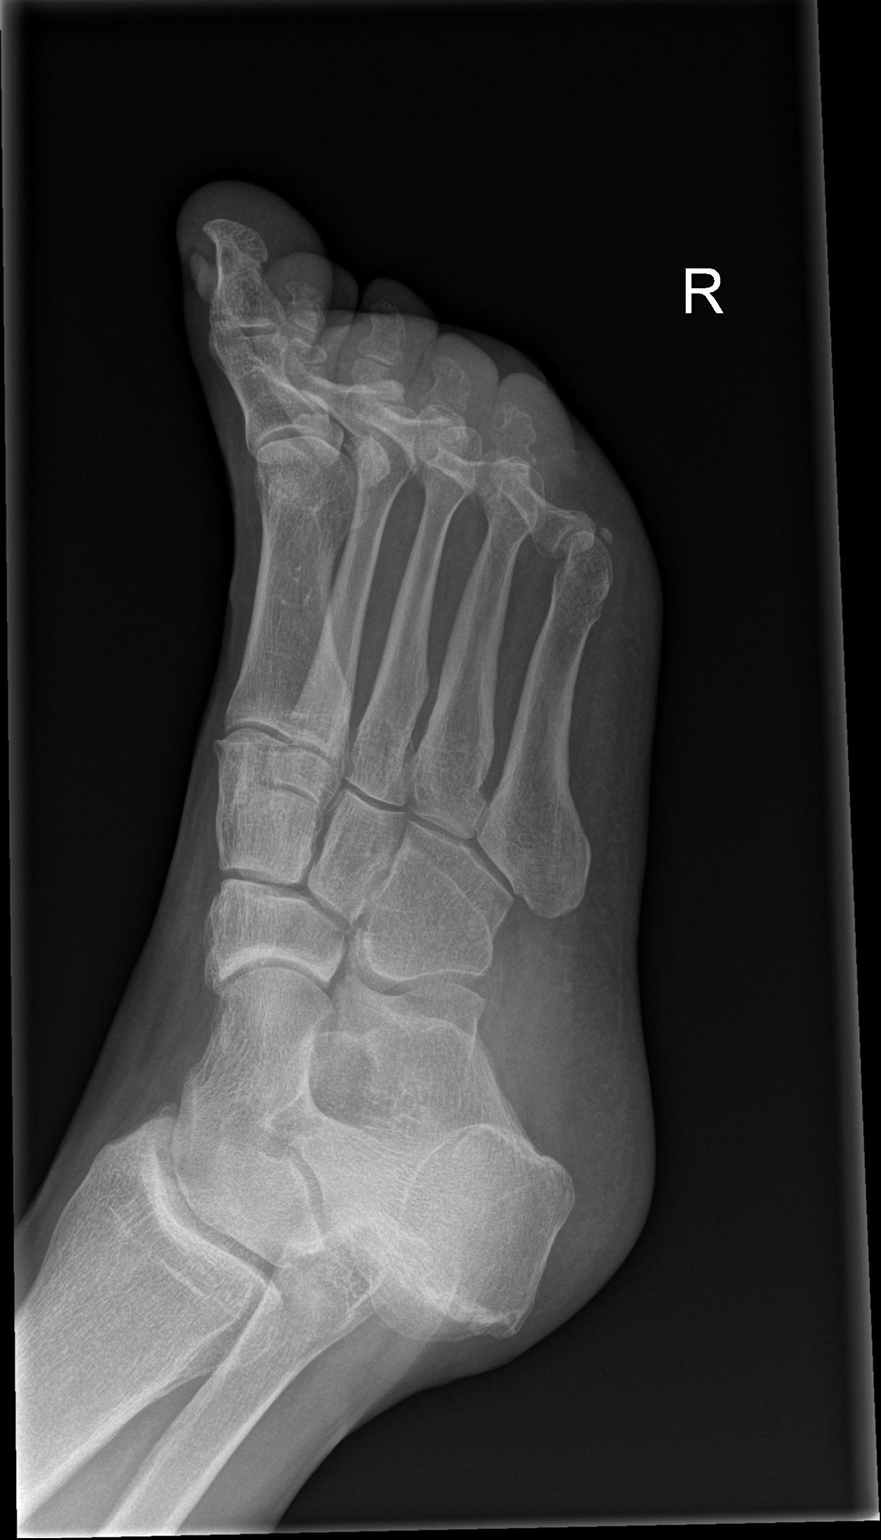

[x foot lat right]
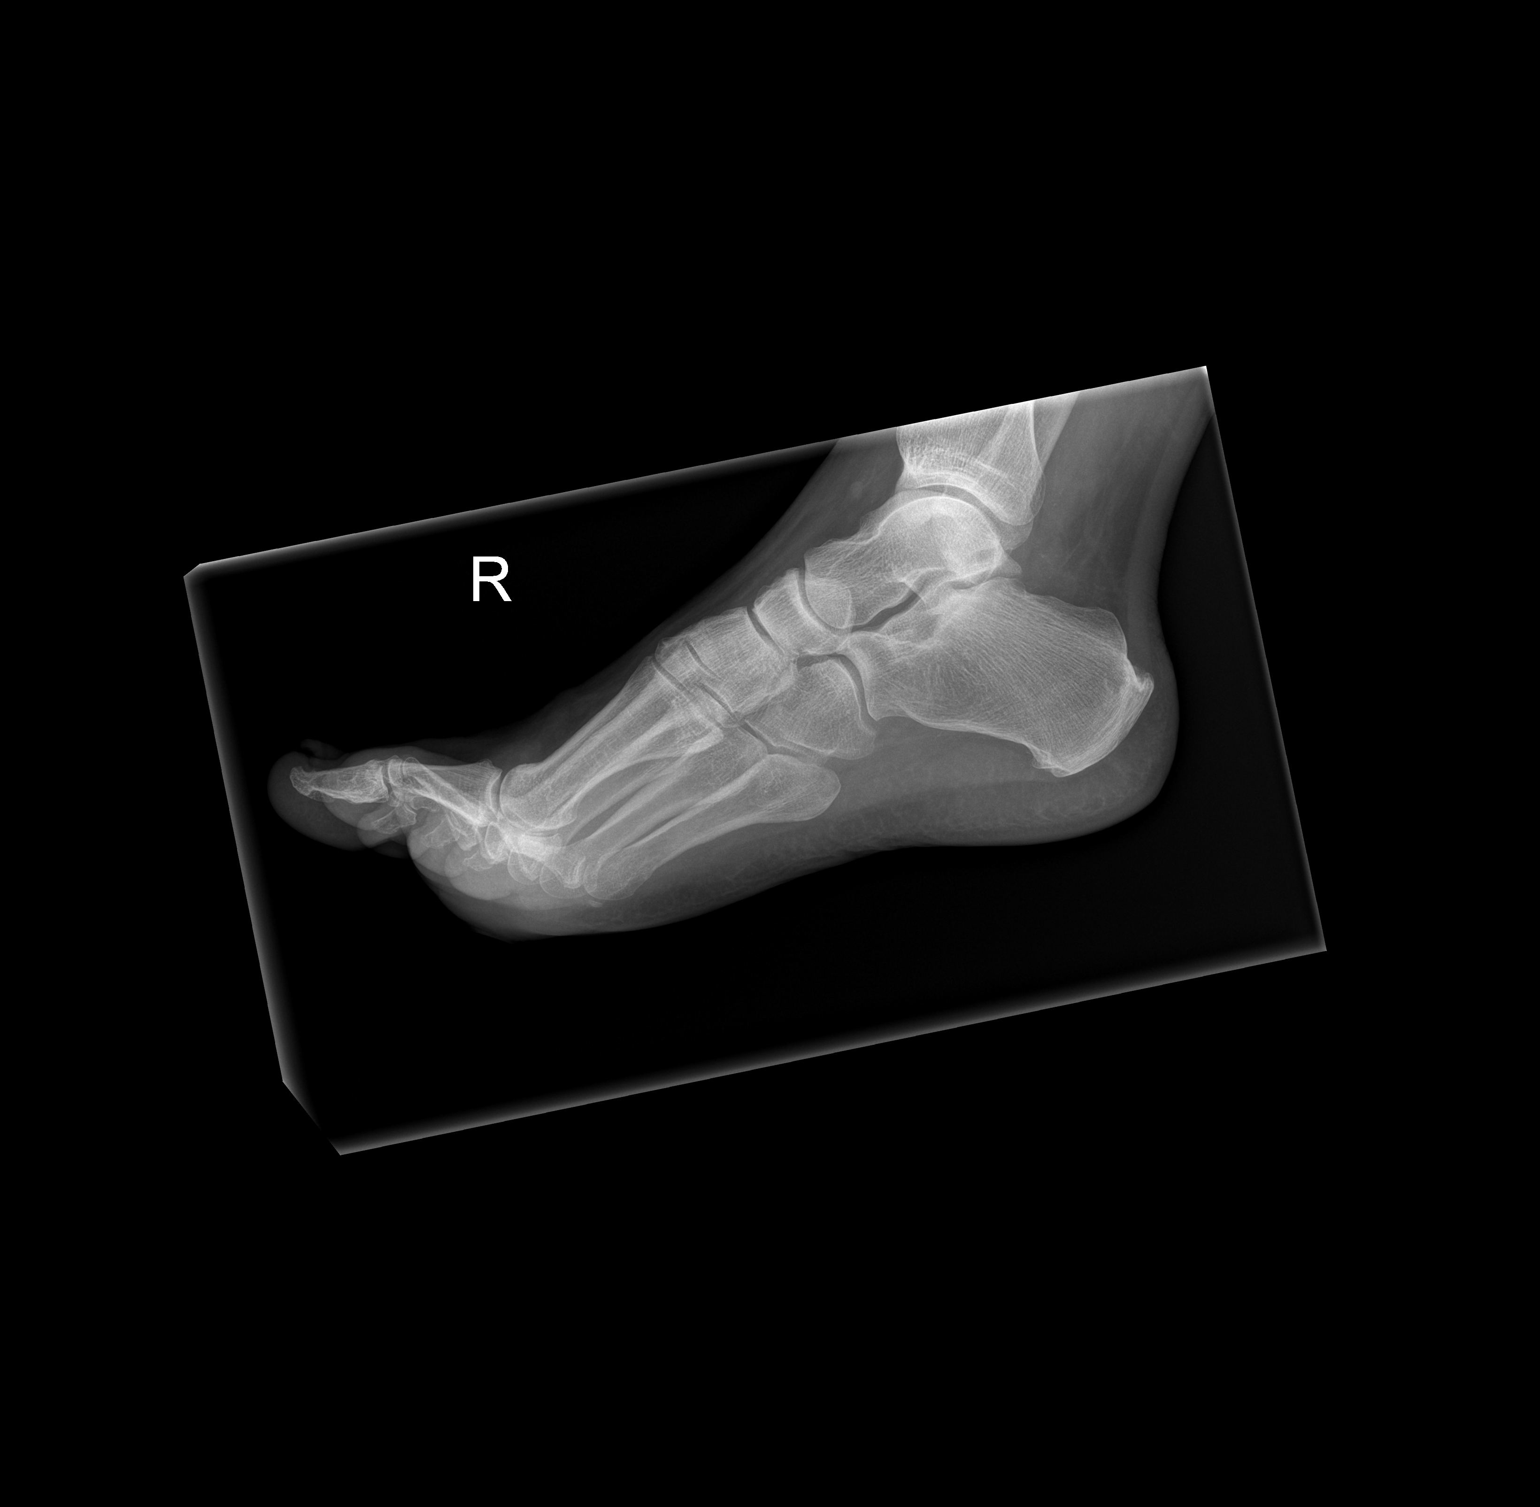

[3 of 3 positions shown; findings below may reference images not displayed]

FINDINGS: No evidence of osteomyelitis is identified. There is no fracture or
dislocation. No soft tissue gas collection or radiopaque foreign
body.
IMPRESSION: No acute finding.

## 2015-07-22 ENCOUNTER — Other Ambulatory Visit: Payer: Self-pay | Admitting: Legal Medicine

## 2015-07-22 DIAGNOSIS — S91331S Puncture wound without foreign body, right foot, sequela: Secondary | ICD-10-CM

## 2015-07-22 DIAGNOSIS — S81831S Puncture wound without foreign body, right lower leg, sequela: Secondary | ICD-10-CM

## 2015-07-23 ENCOUNTER — Ambulatory Visit
Admission: RE | Admit: 2015-07-23 | Discharge: 2015-07-23 | Disposition: A | Discharge status: Home / Self Care | Payer: Worker's Compensation | Source: Ambulatory Visit | Attending: Legal Medicine | Admitting: Legal Medicine

## 2015-07-23 DIAGNOSIS — S91331S Puncture wound without foreign body, right foot, sequela: Secondary | ICD-10-CM

## 2015-07-23 MED ORDER — GADOBENATE DIMEGLUMINE 529 MG/ML IV SOLN
20.0000 mL | Freq: Once | INTRAVENOUS | Status: AC | PRN
  Administered 2015-07-23: 20 mL via INTRAVENOUS

## 2017-04-11 IMAGING — MR MR FOOT*R* WO/W CM
4 of 9 series · 15 of 40 positions shown · IV contrast (multihance)
Comparison: None.

CLINICAL DATA: Stepped on a E wire that punctured the bottom of the
foot. Swelling.

EXAM:
MRI OF THE RIGHT FOREFOOT WITHOUT AND WITH CONTRAST
TECHNIQUE: Multiplanar, multisequence MR imaging was performed both before and
after administration of intravenous contrast.
CONTRAST:  20mL MULTIHANCE GADOBENATE DIMEGLUMINE 529 MG/ML IV SOLN

[Series 4: T2 fat-sat · sagittal · 3.0mm · 0.34mm/px · 3 of 27 slices shown (1 of 3)]
[im 1/27]
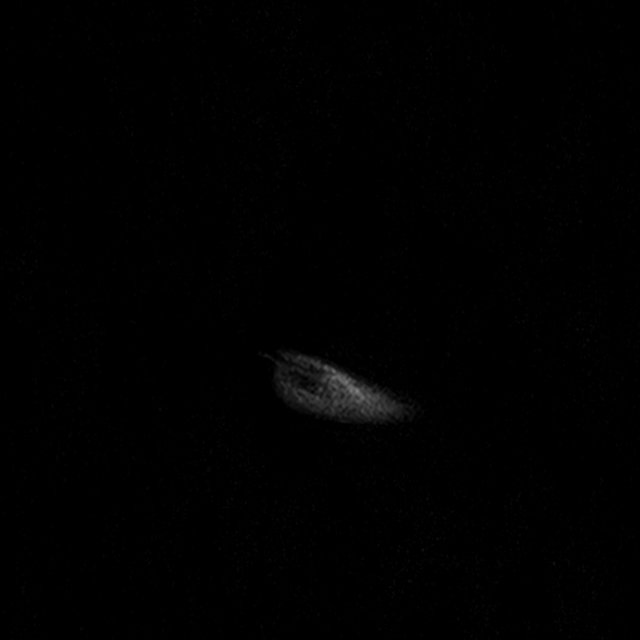
[im 14/27]
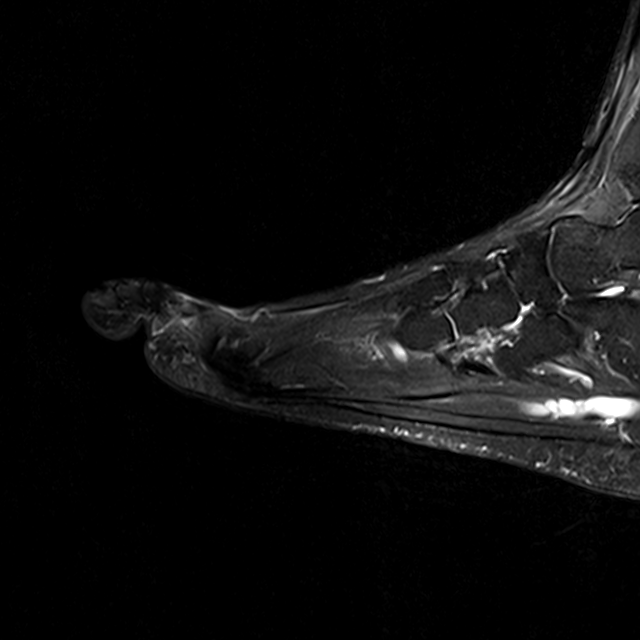
[im 27/27]
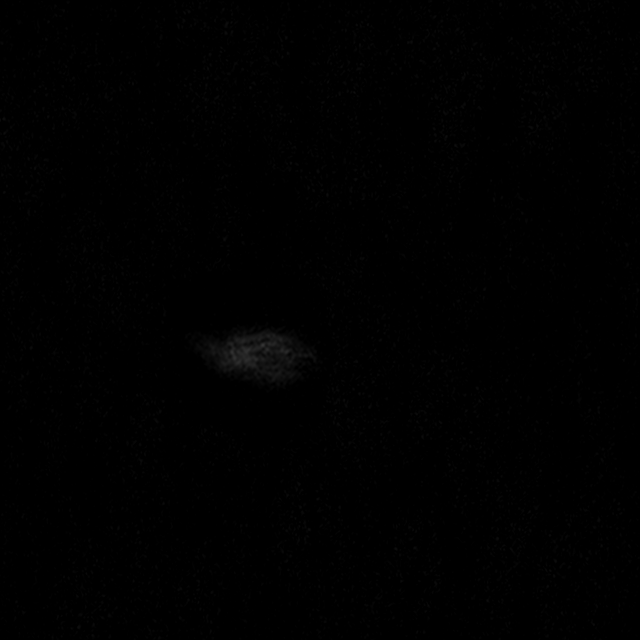

[Series 7: T1 · coronal · 4.0mm · 0.20mm/px · 3 of 44 slices shown]
[im 9/44]
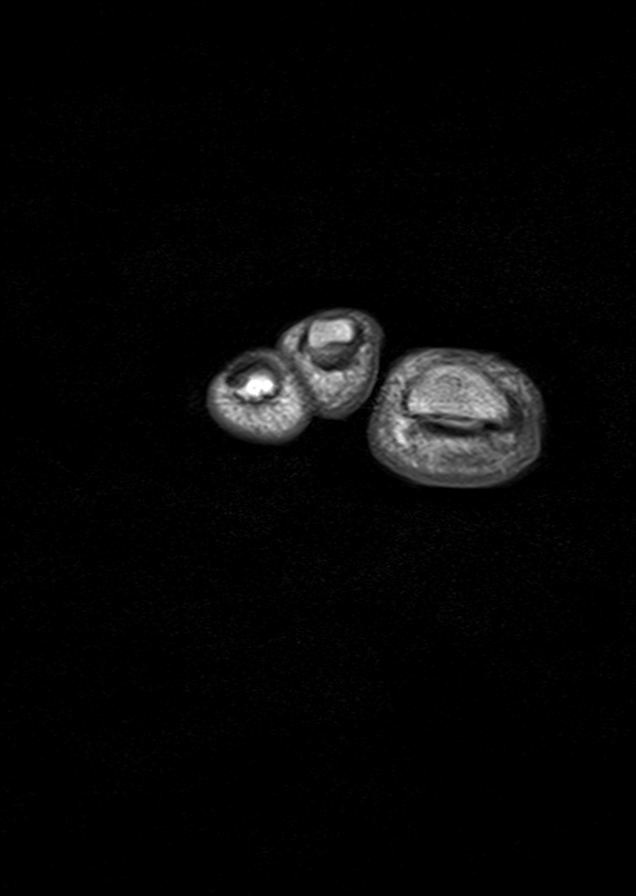
[im 26/44]
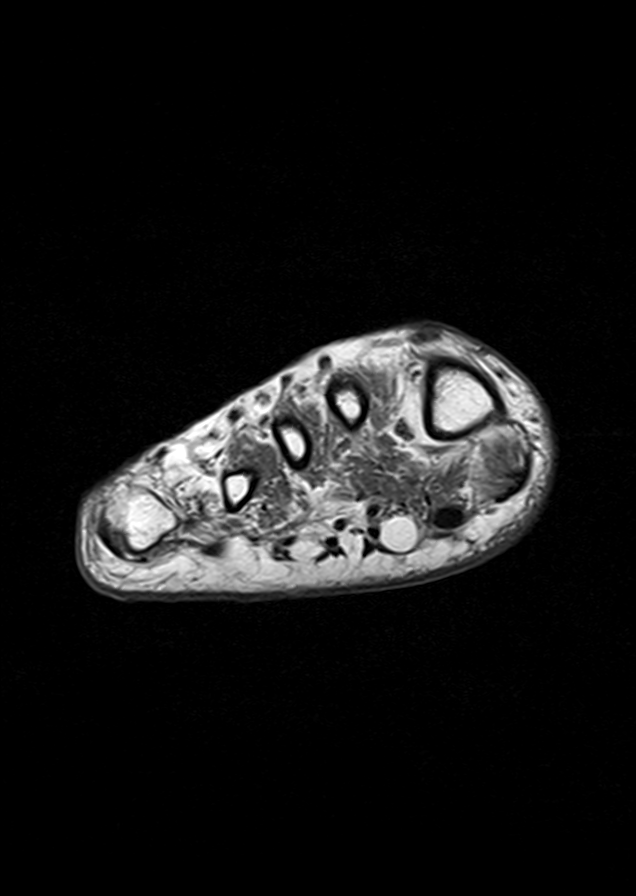
[im 44/44]
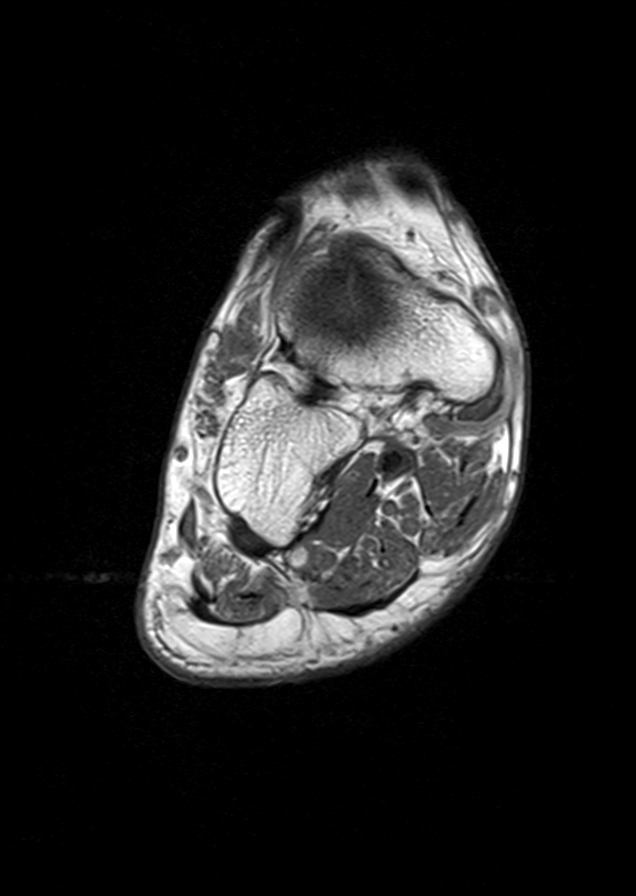

[Series 8: T2 fat-sat · coronal · 4.0mm · 0.28mm/px · 6 of 44 slices shown (2 of 3)]
[im 1/44]
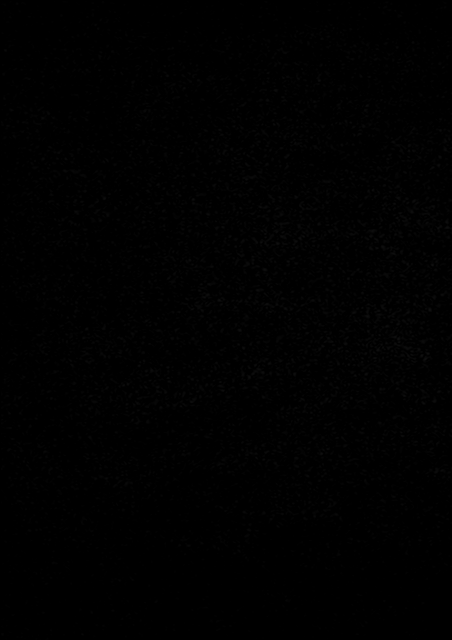
[im 9/44]
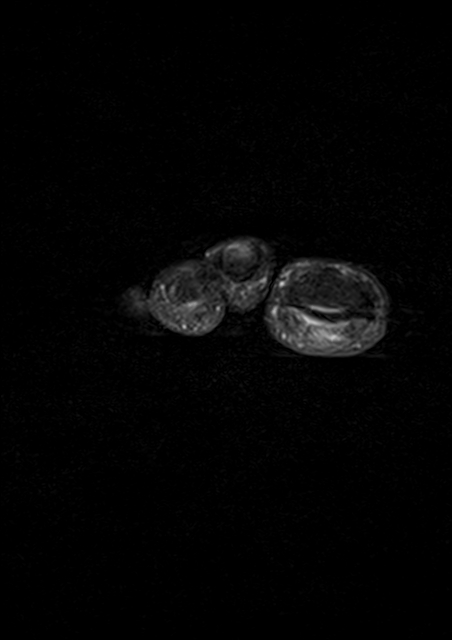
[im 18/44]
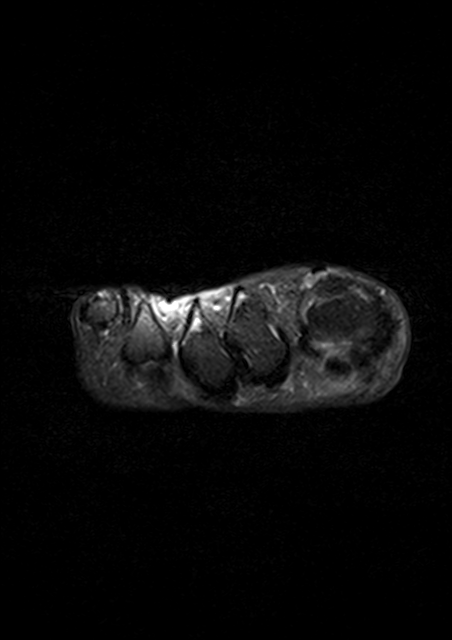
[im 26/44]
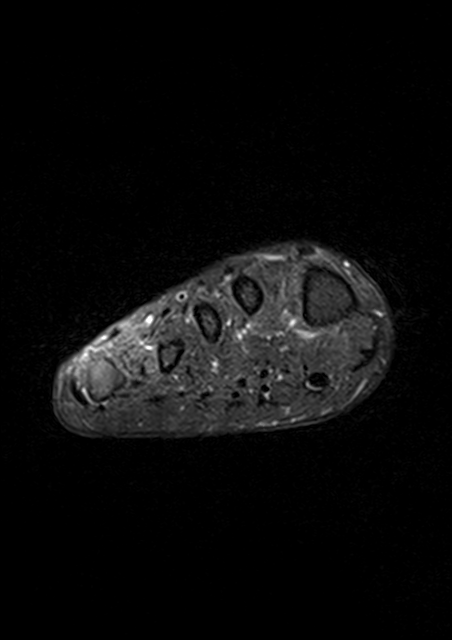
[im 35/44]
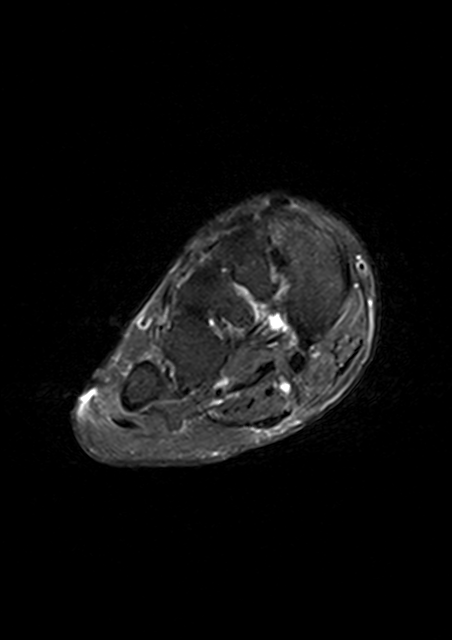
[im 44/44]
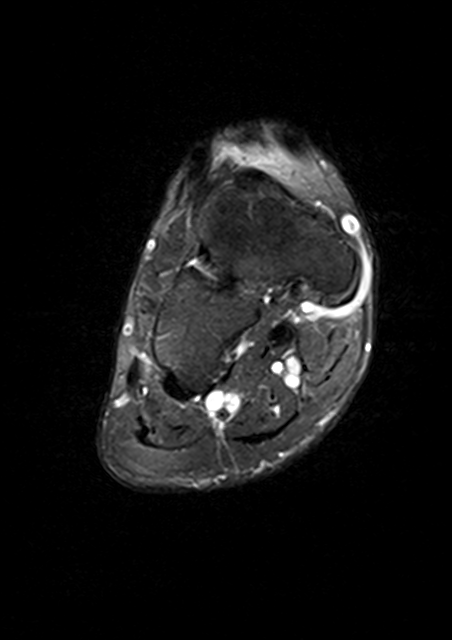

[Series 10: T2 fat-sat · axial · 3.0mm · 0.39mm/px · z∈[-111,-47]mm · 3 of 22 slices shown (3 of 3)]
[im 1/22]
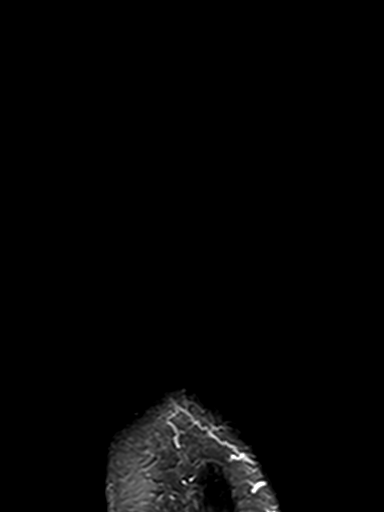
[im 11/22]
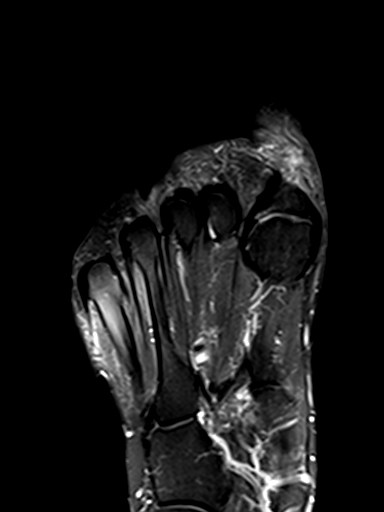
[im 22/22]
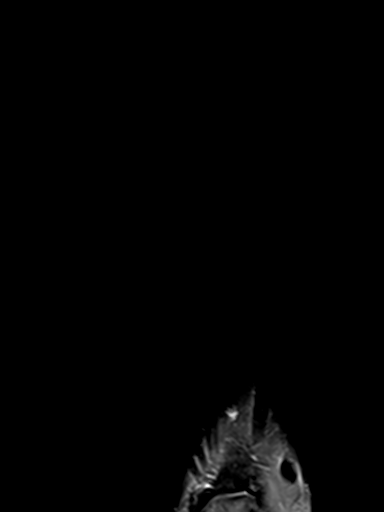

[15 of 40 positions shown; findings below may reference images not displayed]

FINDINGS: No acute fracture or dislocation. Mild edema in the medial cuneiform
likely secondary to mild osteoarthritis. There is no periosteal
reaction or bone destruction. There is no aggressive osseous lesion.
There is no joint effusion.

There is no fluid collection or hematoma. There is mild soft tissue
edema along the plantar aspect of the first MTP joint concerning for
mild cellulitis.

The flexor, extensor and peroneal tendons are intact.
IMPRESSION: 1. No evidence of osteomyelitis of the right forefoot.
2. Mild cellulitis along the plantar aspect of the first MTP joint
without a drainable fluid collection.

## 2020-02-10 DIAGNOSIS — I2510 Atherosclerotic heart disease of native coronary artery without angina pectoris: Secondary | ICD-10-CM | POA: Insufficient documentation

## 2020-02-10 DIAGNOSIS — I252 Old myocardial infarction: Secondary | ICD-10-CM | POA: Insufficient documentation

## 2020-02-12 DIAGNOSIS — D62 Acute posthemorrhagic anemia: Secondary | ICD-10-CM | POA: Insufficient documentation

## 2020-02-12 DIAGNOSIS — R918 Other nonspecific abnormal finding of lung field: Secondary | ICD-10-CM | POA: Insufficient documentation

## 2020-02-12 DIAGNOSIS — E1169 Type 2 diabetes mellitus with other specified complication: Secondary | ICD-10-CM | POA: Insufficient documentation

## 2020-02-12 DIAGNOSIS — E785 Hyperlipidemia, unspecified: Secondary | ICD-10-CM | POA: Insufficient documentation

## 2020-03-04 DIAGNOSIS — K5010 Crohn's disease of large intestine without complications: Secondary | ICD-10-CM | POA: Insufficient documentation

## 2020-03-04 DIAGNOSIS — Z951 Presence of aortocoronary bypass graft: Secondary | ICD-10-CM | POA: Insufficient documentation

## 2020-03-04 DIAGNOSIS — Z7902 Long term (current) use of antithrombotics/antiplatelets: Secondary | ICD-10-CM | POA: Insufficient documentation

## 2020-03-04 DIAGNOSIS — Z8673 Personal history of transient ischemic attack (TIA), and cerebral infarction without residual deficits: Secondary | ICD-10-CM | POA: Insufficient documentation

## 2020-03-04 DIAGNOSIS — I5189 Other ill-defined heart diseases: Secondary | ICD-10-CM | POA: Insufficient documentation

## 2020-03-04 DIAGNOSIS — E663 Overweight: Secondary | ICD-10-CM | POA: Insufficient documentation

## 2020-03-06 DIAGNOSIS — G4733 Obstructive sleep apnea (adult) (pediatric): Secondary | ICD-10-CM | POA: Insufficient documentation

## 2020-03-06 DIAGNOSIS — M12811 Other specific arthropathies, not elsewhere classified, right shoulder: Secondary | ICD-10-CM | POA: Insufficient documentation

## 2020-03-06 DIAGNOSIS — K4091 Unilateral inguinal hernia, without obstruction or gangrene, recurrent: Secondary | ICD-10-CM | POA: Insufficient documentation

## 2020-05-14 DIAGNOSIS — E538 Deficiency of other specified B group vitamins: Secondary | ICD-10-CM | POA: Insufficient documentation

## 2020-07-20 DIAGNOSIS — Z09 Encounter for follow-up examination after completed treatment for conditions other than malignant neoplasm: Secondary | ICD-10-CM | POA: Insufficient documentation

## 2020-10-15 DIAGNOSIS — F17210 Nicotine dependence, cigarettes, uncomplicated: Secondary | ICD-10-CM | POA: Insufficient documentation

## 2020-11-11 DIAGNOSIS — I25110 Atherosclerotic heart disease of native coronary artery with unstable angina pectoris: Secondary | ICD-10-CM | POA: Insufficient documentation

## 2020-11-24 DIAGNOSIS — M25611 Stiffness of right shoulder, not elsewhere classified: Secondary | ICD-10-CM | POA: Insufficient documentation

## 2020-11-26 ENCOUNTER — Ambulatory Visit: Payer: Self-pay | Admitting: Podiatry

## 2020-11-27 ENCOUNTER — Other Ambulatory Visit: Payer: Self-pay

## 2020-11-27 ENCOUNTER — Ambulatory Visit (INDEPENDENT_AMBULATORY_CARE_PROVIDER_SITE_OTHER): Payer: BC Managed Care – PPO | Admitting: Sports Medicine

## 2020-11-27 ENCOUNTER — Encounter: Payer: Self-pay | Admitting: Sports Medicine

## 2020-11-27 DIAGNOSIS — M79676 Pain in unspecified toe(s): Secondary | ICD-10-CM | POA: Diagnosis not present

## 2020-11-27 DIAGNOSIS — R61 Generalized hyperhidrosis: Secondary | ICD-10-CM | POA: Diagnosis not present

## 2020-11-27 DIAGNOSIS — E1159 Type 2 diabetes mellitus with other circulatory complications: Secondary | ICD-10-CM | POA: Insufficient documentation

## 2020-11-27 DIAGNOSIS — Q828 Other specified congenital malformations of skin: Secondary | ICD-10-CM

## 2020-11-27 DIAGNOSIS — E1142 Type 2 diabetes mellitus with diabetic polyneuropathy: Secondary | ICD-10-CM

## 2020-11-27 DIAGNOSIS — B351 Tinea unguium: Secondary | ICD-10-CM | POA: Diagnosis not present

## 2020-11-27 DIAGNOSIS — I152 Hypertension secondary to endocrine disorders: Secondary | ICD-10-CM | POA: Insufficient documentation

## 2020-11-27 DIAGNOSIS — L989 Disorder of the skin and subcutaneous tissue, unspecified: Secondary | ICD-10-CM

## 2020-11-27 MED ORDER — TERBINAFINE HCL 250 MG PO TABS: ORAL_TABLET | ORAL | 0 refills | Status: DC

## 2020-11-27 MED ORDER — TERBINAFINE HCL 250 MG PO TABS: ORAL_TABLET | ORAL | 0 refills | Status: AC

## 2020-11-27 NOTE — Progress Notes (Addendum)
Subjective: Mikael Skoda is a 50 y.o. male patient with history of diabetes who presents to office today complaining of severe pain at callus areas balls of both feet right worse.  Patient reports that he tends to have to trim down his calluses every 2 weeks and sometimes gets very close and has noticed a dark speck of blood underneath his right foot callus.  Patient reports that he also is concerned about the changes in his nails and states that he is diabetic he does not feel his feet he does several hours of walking and standing on concrete surfaces at work and reports that he needs some new work boots.  Fasting blood sugar not recorded at this visit.  Patient Active Problem List   Diagnosis Date Noted   Hypertension associated with type 2 diabetes mellitus (HCC) 11/27/2020   Type 2 diabetes mellitus with diabetic polyneuropathy, without long-term current use of insulin (HCC) 11/27/2020   Decreased ROM of right shoulder 11/24/2020   Coronary artery disease involving native coronary artery of native heart with unstable angina pectoris (HCC) 11/11/2020   Cigarette nicotine dependence, uncomplicated 10/15/2020   Postoperative examination 07/20/2020   B12 deficiency 05/14/2020   OSA (obstructive sleep apnea) 03/06/2020   Recurrent left inguinal hernia 03/06/2020   Rotator cuff arthropathy, right 03/06/2020   Crohn's disease of large intestine without complication (HCC) 03/04/2020   Echocardiogram shows left ventricular diastolic dysfunction 03/04/2020   Long term (current) use of antithrombotics/antiplatelets 03/04/2020   History of stroke 03/04/2020   Hx of two vessel coronary artery bypass graft 03/04/2020   Overweight (BMI 25.0-29.9) 03/04/2020   Hyperlipidemia associated with type 2 diabetes mellitus (HCC) 02/12/2020   Postoperative anemia due to acute blood loss 02/12/2020   Pulmonary nodules 02/12/2020   Coronary arteriosclerosis in patient with history of previous myocardial  infarction 02/10/2020   Current Outpatient Medications on File Prior to Visit  Medication Sig Dispense Refill   cyanocobalamin 1000 MCG tablet Take 1 tablet by mouth daily.     metFORMIN (GLUCOPHAGE) 1000 MG tablet Take by mouth.     acetaminophen (TYLENOL) 500 MG tablet Take 1,000 mg by mouth every 6 (six) hours as needed for moderate pain.     acetaminophen (TYLENOL) 500 MG tablet Take by mouth.     albuterol (VENTOLIN HFA) 108 (90 Base) MCG/ACT inhaler SMARTSIG:1 Puff(s) By Mouth As Directed     amLODipine (NORVASC) 2.5 MG tablet Take 2.5 mg by mouth every morning.     atorvastatin (LIPITOR) 20 MG tablet Take 20 mg by mouth at bedtime.     buPROPion (WELLBUTRIN) 75 MG tablet Take 75 mg by mouth 2 (two) times daily.     clopidogrel (PLAVIX) 75 MG tablet Take 75 mg by mouth daily.     gabapentin (NEURONTIN) 300 MG capsule Take 600 mg by mouth at bedtime.     JARDIANCE 25 MG TABS tablet Take 25 mg by mouth daily.     lisinopril (ZESTRIL) 5 MG tablet Take 5 mg by mouth daily.     metoprolol tartrate (LOPRESSOR) 25 MG tablet Take 25 mg by mouth 2 (two) times daily.     pantoprazole (PROTONIX) 40 MG tablet Take 40 mg by mouth daily.     No current facility-administered medications on file prior to visit.   No Known Allergies  No results found for this or any previous visit (from the past 2160 hour(s)).  Objective: General: Patient is awake, alert, and oriented x 3 and in no  acute distress.  Integument: Skin is warm, dry and supple bilateral. Nails are mildly elongated thickened and dystrophic with subungual debris, consistent with onychomycosis, 1-5 bilateral with right greater than left hallux most involved.  There is significant diffuse plantar callusing noted right and left plantar forefoot and left medial first toe with a small amount of dry heme noted to the ball of the right foot. No signs of infection. No open lesions or preulcerative lesions present bilateral. Remaining integument  unremarkable.  Vasculature:  Dorsalis Pedis pulse 1/4 bilateral. Posterior Tibial pulse 1/4 bilateral.  Capillary fill time <3 sec 1-5 bilateral. Positive hair growth to the level of the digits. Temperature gradient within normal limits. No varicosities present bilateral. No edema present bilateral.   Neurology: Gross sensation present via light touch however protective sensation diminished patient suffers from neuropathy on gabapentin.  Musculoskeletal: Prominent fifth metatarsal head and base right greater than left foot.  No symptomatic pedal deformities noted bilateral. Muscular strength 5/5 in all lower extremity muscular groups bilateral without pain on range of motion .  There is tenderness palpation to the keratotic area right greater than left foot.  No tenderness with calf compression bilateral.  Assessment and Plan: Problem List Items Addressed This Visit       Endocrine   Type 2 diabetes mellitus with diabetic polyneuropathy, without long-term current use of insulin (HCC)   Relevant Medications   atorvastatin (LIPITOR) 20 MG tablet   buPROPion (WELLBUTRIN) 75 MG tablet   JARDIANCE 25 MG TABS tablet   gabapentin (NEURONTIN) 300 MG capsule   lisinopril (ZESTRIL) 5 MG tablet   metFORMIN (GLUCOPHAGE) 1000 MG tablet   Other Visit Diagnoses     Benign skin lesion    -  Primary   Porokeratosis       Hyperhidrosis       Pain due to onychomycosis of toenail       Relevant Medications   terbinafine (LAMISIL) 250 MG tablet         -Examined patient. -Discussed and educated patient on diabetic foot care, especially with  regards to the vascular, neurological and musculoskeletal systems.  -Stressed the importance of good glycemic control and the detriment of not  controlling glucose levels in relation to the foot. -Mechanically debrided all nails 1-5 bilateral using sterile nail nipper and filed with dremel without incident  -At no additional charge mechanically debrided  keratosis plantar forefoot bilateral and left great toe using a 15 blade without incident -Started patient on Lamisil 250 mg tablets take 1 week out of each month pulse dose fashion -Advised patient to change out his socks and to also get over-the-counter foot spray foot powder or deodorant spray for feet for excessive moisture -Applied offloading padding to shoes and advised patient to get new work shoes and insoles -Answered all patient questions -Patient to return  in 6 weeks for medication check at this visit we will also cast patient for diabetic insoles/orthotics -Patient advised to call the office if any problems or questions arise in the meantime.  Asencion Islam, DPM

## 2021-01-13 ENCOUNTER — Ambulatory Visit: Payer: BC Managed Care – PPO | Admitting: Sports Medicine
